# Patient Record
Sex: Male | Born: 1975 | Race: White | Hispanic: No | Smoking: Former smoker
Health system: Southern US, Community
[De-identification: ages and names within clinical notes are randomized; demographics above are authoritative.]

## PROBLEM LIST (undated history)

## (undated) DIAGNOSIS — Q893 Situs inversus: Secondary | ICD-10-CM

## (undated) DIAGNOSIS — M19079 Primary osteoarthritis, unspecified ankle and foot: Secondary | ICD-10-CM

## (undated) DIAGNOSIS — R195 Other fecal abnormalities: Secondary | ICD-10-CM

## (undated) DIAGNOSIS — M170 Bilateral primary osteoarthritis of knee: Secondary | ICD-10-CM

## (undated) DIAGNOSIS — I10 Essential (primary) hypertension: Secondary | ICD-10-CM

## (undated) DIAGNOSIS — J339 Nasal polyp, unspecified: Secondary | ICD-10-CM

## (undated) DIAGNOSIS — R030 Elevated blood-pressure reading, without diagnosis of hypertension: Secondary | ICD-10-CM

## (undated) DIAGNOSIS — M1990 Unspecified osteoarthritis, unspecified site: Secondary | ICD-10-CM

## (undated) HISTORY — PX: TONSILLECTOMY: SUR1361

## (undated) HISTORY — PX: OTHER SURGICAL HISTORY: SHX169

## (undated) HISTORY — PX: SEPTOPLASTY: SUR1290

---

## 2016-01-26 ENCOUNTER — Encounter: Admission: RE | Payer: Self-pay | Source: Ambulatory Visit

## 2016-01-26 ENCOUNTER — Ambulatory Visit: Admission: RE | Admit: 2016-01-26 | Payer: BLUE CROSS/BLUE SHIELD | Source: Ambulatory Visit | Admitting: Otolaryngology

## 2016-01-26 SURGERY — SINUS SURGERY, WITH IMAGING GUIDANCE
Anesthesia: General

## 2016-09-18 ENCOUNTER — Encounter: Payer: Self-pay | Admitting: Emergency Medicine

## 2016-09-18 ENCOUNTER — Ambulatory Visit (INDEPENDENT_AMBULATORY_CARE_PROVIDER_SITE_OTHER): Payer: Self-pay

## 2016-09-18 ENCOUNTER — Ambulatory Visit
Admission: EM | Admit: 2016-09-18 | Discharge: 2016-09-18 | Disposition: A | Discharge status: Home / Self Care | Payer: Self-pay | Attending: Emergency Medicine | Admitting: Emergency Medicine

## 2016-09-18 DIAGNOSIS — N179 Acute kidney failure, unspecified: Secondary | ICD-10-CM

## 2016-09-18 DIAGNOSIS — J209 Acute bronchitis, unspecified: Secondary | ICD-10-CM

## 2016-09-18 LAB — CBC WITH DIFFERENTIAL/PLATELET
BASOS PCT: 0 %
Basophils Absolute: 0 10*3/uL (ref 0–0.1)
Eosinophils Absolute: 0 10*3/uL (ref 0–0.7)
Eosinophils Relative: 0 %
HEMATOCRIT: 51.7 % (ref 40.0–52.0)
HEMOGLOBIN: 17.7 g/dL (ref 13.0–18.0)
LYMPHS PCT: 11 %
Lymphs Abs: 1.2 10*3/uL (ref 1.0–3.6)
MCH: 30.5 pg (ref 26.0–34.0)
MCHC: 34.2 g/dL (ref 32.0–36.0)
MCV: 89.1 fL (ref 80.0–100.0)
Monocytes Absolute: 1.1 10*3/uL — ABNORMAL HIGH (ref 0.2–1.0)
Monocytes Relative: 11 %
NEUTROS ABS: 7.9 10*3/uL — AB (ref 1.4–6.5)
NEUTROS PCT: 78 %
Platelets: 127 10*3/uL — ABNORMAL LOW (ref 150–440)
RBC: 5.81 MIL/uL (ref 4.40–5.90)
RDW: 12.5 % (ref 11.5–14.5)
WBC: 10.2 10*3/uL (ref 3.8–10.6)

## 2016-09-18 LAB — BASIC METABOLIC PANEL
ANION GAP: 11 (ref 5–15)
BUN: 17 mg/dL (ref 6–20)
CALCIUM: 9 mg/dL (ref 8.9–10.3)
CO2: 22 mmol/L (ref 22–32)
Chloride: 98 mmol/L — ABNORMAL LOW (ref 101–111)
Creatinine, Ser: 1.61 mg/dL — ABNORMAL HIGH (ref 0.61–1.24)
GFR calc non Af Amer: 52 mL/min — ABNORMAL LOW (ref >60)
GFR, EST AFRICAN AMERICAN: 60 mL/min — AB (ref >60)
Glucose, Bld: 122 mg/dL — ABNORMAL HIGH (ref 65–99)
POTASSIUM: 4.1 mmol/L (ref 3.5–5.1)
Sodium: 131 mmol/L — ABNORMAL LOW (ref 135–145)

## 2016-09-18 LAB — RAPID INFLUENZA A&B ANTIGENS (ARMC ONLY)
INFLUENZA A (ARMC): NEGATIVE
INFLUENZA B (ARMC): NEGATIVE

## 2016-09-18 MED ORDER — HYDROCOD POLST-CPM POLST ER 10-8 MG/5ML PO SUER: 5.0000 mL | Freq: Every evening | ORAL | 0 refills | Status: DC | PRN

## 2016-09-18 MED ORDER — DOXYCYCLINE HYCLATE 100 MG PO CAPS: 100.0000 mg | ORAL_CAPSULE | Freq: Two times a day (BID) | ORAL | 0 refills | Status: DC

## 2016-09-18 MED ORDER — IPRATROPIUM-ALBUTEROL 0.5-2.5 (3) MG/3ML IN SOLN
3.0000 mL | Freq: Once | RESPIRATORY_TRACT | Status: AC
Start: 2016-09-18 — End: 2016-09-18
  Administered 2016-09-18: 3 mL via RESPIRATORY_TRACT

## 2016-09-18 MED ORDER — PREDNISONE 10 MG PO TABS
ORAL_TABLET | ORAL | 0 refills | Status: DC
Start: 2016-09-18 — End: 2022-04-03

## 2016-09-18 NOTE — ED Provider Notes (Signed)
[facility] ____________________________________________  Time seen: Approximately 12:01 PM  I have reviewed the triage vital signs and the nursing notes.   HISTORY  Chief Complaint Cough and Fever  HPI Danny Hanson is a 41 y.o. male presents for the complaints of 5 days of cough, chest congestion, runny nose, nasal congestion and body aches. Patient states that he is here because of "bad cough". Reports fever maximum of 102 orally within the first few days of symptom onset. Patient reports gradual onset of wheezing, chest tightness with cough. Patient states the cough is primarily nonproductive. Patient reports that he has intermittently used home albuterol nebulizer without symptom resolution. States home albuterol helps briefly, but reports he is only using twice a day. Reports he last used home albuterol inhaler a few hours ago and states he does not want a breathing treatment here today. Patient reports that he feels sore all over from coughing including soreness in his stomach as well as chest and states that this is from coughing. Denies chest pain.Denies shortness of breath. States chest congestion and wheezing, but states NO shortness of breath or pain with breathing.  Patient reports that he has not been sleeping well at all because of the cough. Patient reports some intermittent diarrhea. Some nausea. Denies vomiting. Reports continues to drink fluids well, decreased appetite. Denies abdominal pain. Denies recent sickness, recent hospitalization, extremity pain, extremity swelling, fall or injury. Denies hemoptysis. Denies hematochezia, melena or abnormal stools. Patient reports symptoms unresolved with over-the-counter cough and congestion medications. Patient also reports that he had a few leftover Augmentin pills and he has been taking without any change of symptoms.  Patient reports that he does have a history of chronic sinusitis and bronchitis, and he is a  smoker.  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  There are no active problems to display for this patient.  surgical history.  Reports he had tympanostomy tubes placed last year. Denies other surgeries.  No current facility-administered medications for this encounter.   Current Outpatient Prescriptions:  .  albuterol (PROVENTIL HFA;VENTOLIN HFA) 108 (90 Base) MCG/ACT inhaler, Inhale 2 puffs into the lungs every 6 (six) hours as needed for wheezing or shortness of breath., Disp: , Rfl:  .  amoxicillin-clavulanate (AUGMENTIN) 875-125 MG tablet, Take 1 tablet by mouth 2 (two) times daily., Disp: , Rfl:  .  chlorpheniramine-HYDROcodone (TUSSIONEX PENNKINETIC ER) 10-8 MG/5ML SUER, Take 5 mLs by mouth at bedtime as needed. do not drive or operate machinery while taking as can cause drowsiness., Disp: 75 mL, Rfl: 0 .  doxycycline (VIBRAMYCIN) 100 MG capsule, Take 1 capsule (100 mg total) by mouth 2 (two) times daily., Disp: 20 capsule, Rfl: 0 .  predniSONE (DELTASONE) 10 MG tablet, Start 60 mg po day one, then 50 mg po day two, taper by 10 mg daily until complete., Disp: 21 tablet, Rfl: 0  Allergies Shellfish allergy  family history Father: Liver issues, alcoholism Mother: Reports alive and well.  Social History Social History  Substance Use Topics  . Smoking status: Current Every Day Smoker    Packs/day: 1.00    Types: Cigarettes  . Smokeless tobacco: Never Used  . Alcohol use Yes  States occasional alcohol use. Denies drug use.  Review of Systems Constitutional: As above. Eyes: No visual changes. ENT: Denies sore throat. Cardiovascular: Denies chest pain. Respiratory: Denies shortness of breath. Gastrointestinal:No nausea, no vomiting.   No constipation. Genitourinary: Negative for dysuria.  Musculoskeletal: Negative for back pain. Skin: Negative for rash. Neurological: Negative for focal weakness or  numbness.  10-point ROS otherwise negative.  ____________________________________________   PHYSICAL EXAM:  VITAL SIGNS: ED Triage Vitals  Enc Vitals Group     BP 09/18/16 1054 97/83     Pulse Rate 09/18/16 1054 100     Resp 09/18/16 1054 20     Temp 09/18/16 1054 97.7 F (36.5 C)     Temp Source 09/18/16 1054 Oral     SpO2 09/18/16 1054 95 %     Weight 09/18/16 1051 165 lb (74.8 kg)     Height 09/18/16 1051 5' 6"  (1.676 m)     Head Circumference --      Peak Flow --      Pain Score 09/18/16 1054 7     Pain Loc --      Pain Edu? --      Excl. in [place]? --    Today's Vitals   09/18/16 1157 09/18/16 1246 09/18/16 1406 09/18/16 1407  BP:  113/76    Pulse: (!) 105 (!) 104    Resp:  (!) 21    Temp:      TempSrc:      SpO2: 95% 97%    Weight:      Height:      PainSc:   5  7     Constitutional: Alert and oriented. Well appearing and in no acute distress. Eyes: Conjunctivae are normal. PERRL. EOMI. Head: Atraumatic. No sinus tenderness to palpation. No swelling. No erythema.  Ears: no erythema, normal TMs bilaterally.   Nose:Nasal congestion with clear rhinorrhea  Mouth/Throat: Mucous membranes are moist. Mild pharyngeal erythema. No tonsillar swelling or exudate.  Neck: No stridor.  No cervical spine tenderness to palpation. Hematological/Lymphatic/Immunilogical: No cervical lymphadenopathy. Cardiovascular: Distant heart sounds right chest. Good peripheral circulation. Respiratory:  Moderate diffuse wheezing with scattered rhonchi. Intermittent dry cough noted. Mildly decreased breath sounds in bases. Speaks in complete sentences. Gastrointestinal: Soft and nontender. No CVA tenderness. Musculoskeletal: No lower or upper extremity tenderness nor edema. Ambulatory with steady gait. No cervical, thoracic or lumbar tenderness to palpation. Neurologic:  Normal speech and language. No gait instability. Skin:  Skin is warm, dry and intact. No rash noted. Psychiatric: Mood and  affect are normal. Speech and behavior are normal.  ___________________________________________   LABS (all labs ordered are listed, but only abnormal results are displayed)  Labs Reviewed  CBC WITH DIFFERENTIAL/PLATELET - Abnormal; Notable for the following:       Result Value   Platelets 127 (*)    Neutro Abs 7.9 (*)    Monocytes Absolute 1.1 (*)    All other components within normal limits  BASIC METABOLIC PANEL - Abnormal; Notable for the following:    Sodium 131 (*)    Chloride 98 (*)    Glucose, Bld 122 (*)    Creatinine, Ser 1.61 (*)    GFR calc non Af Amer 52 (*)    GFR calc Af Amer 60 (*)    All other components within normal limits  RAPID INFLUENZA A&B ANTIGENS ([facility] ONLY)    RADIOLOGY  Dg Chest 2 View  Result Date: 09/18/2016 CLINICAL DATA:  Cough and fever for several days EXAM: CHEST  2 VIEW COMPARISON:  None. FINDINGS: Cardiac shadow is directed towards the right and there is evidence of sinus inversus. Upon further conversation with the patient, this history is confirmed. The lungs are clear bilaterally. No sizable  effusion is seen. No bony abnormality is noted. IMPRESSION: Changes consistent with sinus inversus. No acute abnormality is noted. Electronically Signed   By: Inez Catalina M.D.   On: 09/18/2016 12:50   ____________________________________________   PROCEDURES Procedures    INITIAL IMPRESSION / ASSESSMENT AND PLAN / ED COURSE  Pertinent labs & imaging results that were available during my care of the patient were reviewed by me and considered in my medical decision making (see chart for details).  Patient with diffuse wheezing, presented for chief complaint of a bad cough. States that he is not short of breath but here for cough. Cardiac sounds somewhat distant, wheezes present. Discussed in detail with patient concerning regarding diffuse wheezing and report of symptoms. Patient continues to say that he did not want a breathing treatment. Recommend  breathing treatment, patient refused. 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 Patient agreed to evaluate chest x-ray. Ambulatory O2 sat 95%.  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 Re-auscultated cardiac and pulmonary. Distant heart sounds difficulty to clearly auscultate with overlying wheezing. Wheezing throughout. Patient now agrees to breathing treatments. Will administer two duonebs. Denies other evaluation at this time.   1300: Patient reevaluated after breathing treatments. Patient reports that he is feeling better. Wheezes have improved. Patient does still have some scattered wheezing. Patient reports chest tightness has improved. Patient states no chest pain or shortness of breath. Patient again states that he is feeling better and states "I feel more alive ". Chest x-ray per radiologist no acute abnormality noted, sinus inversus.  In further discussing with patient, patient states that he does not want to be in the hospital. Discussed in detail with patient wheezing has improved and patient's reporting that he is feeling better. Discussed with patient and recommends evaluation CBC and BMP, patient now agrees to this. Plan to treat patient with oral prednisone taper, home albuterol treatments, when necessary Tussionex and doxycyline.   1445: Patient reports he is still feeling better. No acute distress. Normal appearance respirations. No appearance of distress. Labs reviewed. Creatinine 1.61, GFR 52.  Suspect acute kidney injury second to diarrhea, and decreased appetite. Discuss his laboratory results in detail with patient and copy given. Discussed the need for patient to have adequate hydration and intake and close follow-up. Patient continuously states he  does not want to be in the hospital. States will eat and drink well. Discussed with patient continues to have PCP follow up in 1-2 days. And have labs reevaluated. Will treat patient with oral doxycycline, prednisone taper, Tussionex and instructed to use home albuterol every 4 hours. Discussed very strict follow-up and return parameters with patient including for any worsening proceeding directly to the emergency room. Patient appears stable at time of discharge.  Discussed follow up with Primary care physician this week. Discussed follow up and return parameters including no resolution or any worsening concerns. Patient verbalized understanding and agreed to plan.   Discussed patient and plan of care with Dr Alphonzo Cruise who reviewed patient, and agrees with plan of care.  ____________________________________________   FINAL CLINICAL IMPRESSION(S) / ED DIAGNOSES  Final diagnoses:  Bronchitis, acute, with bronchospasm  Acute kidney injury [facility])     Discharge Medication List as of 09/18/2016  2:00 PM    START taking these medications   Details  chlorpheniramine-HYDROcodone (TUSSIONEX PENNKINETIC ER) 10-8 MG/5ML SUER Take 5 mLs by mouth at bedtime as needed. do not drive or operate machinery while taking as can cause drowsiness., Starting Sun 09/18/2016, Print    doxycycline (VIBRAMYCIN) 100 MG capsule Take 1 capsule (100 mg total) by mouth 2 (two) times daily., Starting Sun 09/18/2016, Print    predniSONE (DELTASONE) 10 MG tablet Start 60 mg po day one, then 50 mg po day two, taper by 10 mg daily until complete., Print        Note: This dictation was prepared with Dragon dictation along with smaller phrase technology. Any transcriptional errors that result from this process are unintentional.    Clinical Course       Marylene Land, NP 09/18/16 [place], NP 09/18/16 [place], NP 09/18/16 1735

## 2016-09-18 NOTE — ED Triage Notes (Signed)
Patient c/o cough and chest congestion, fever, HAs, nausea, and bodyaches since Wed.

## 2016-09-18 NOTE — ED Notes (Signed)
Patient states that he did a breathing treatment at home about 1 hour ago and does not want to do one here.

## 2016-09-18 NOTE — Discharge Instructions (Signed)
Take medication as prescribed. Rest. Drink plenty of fluids.   Follow up with your primary care physician in 1-2 days. Close follow up is important. Return to Urgent care or ER as discussed, for any shortness of breath, continued fevers, difficulty breathing, for new or worsening concerns.

## 2017-09-19 IMAGING — CR DG CHEST 2V
2 series · 2 of 2 positions shown · non-contrast
Comparison: None.

CLINICAL DATA: Cough and fever for several days

EXAM:
CHEST  2 VIEW

[chest pa]
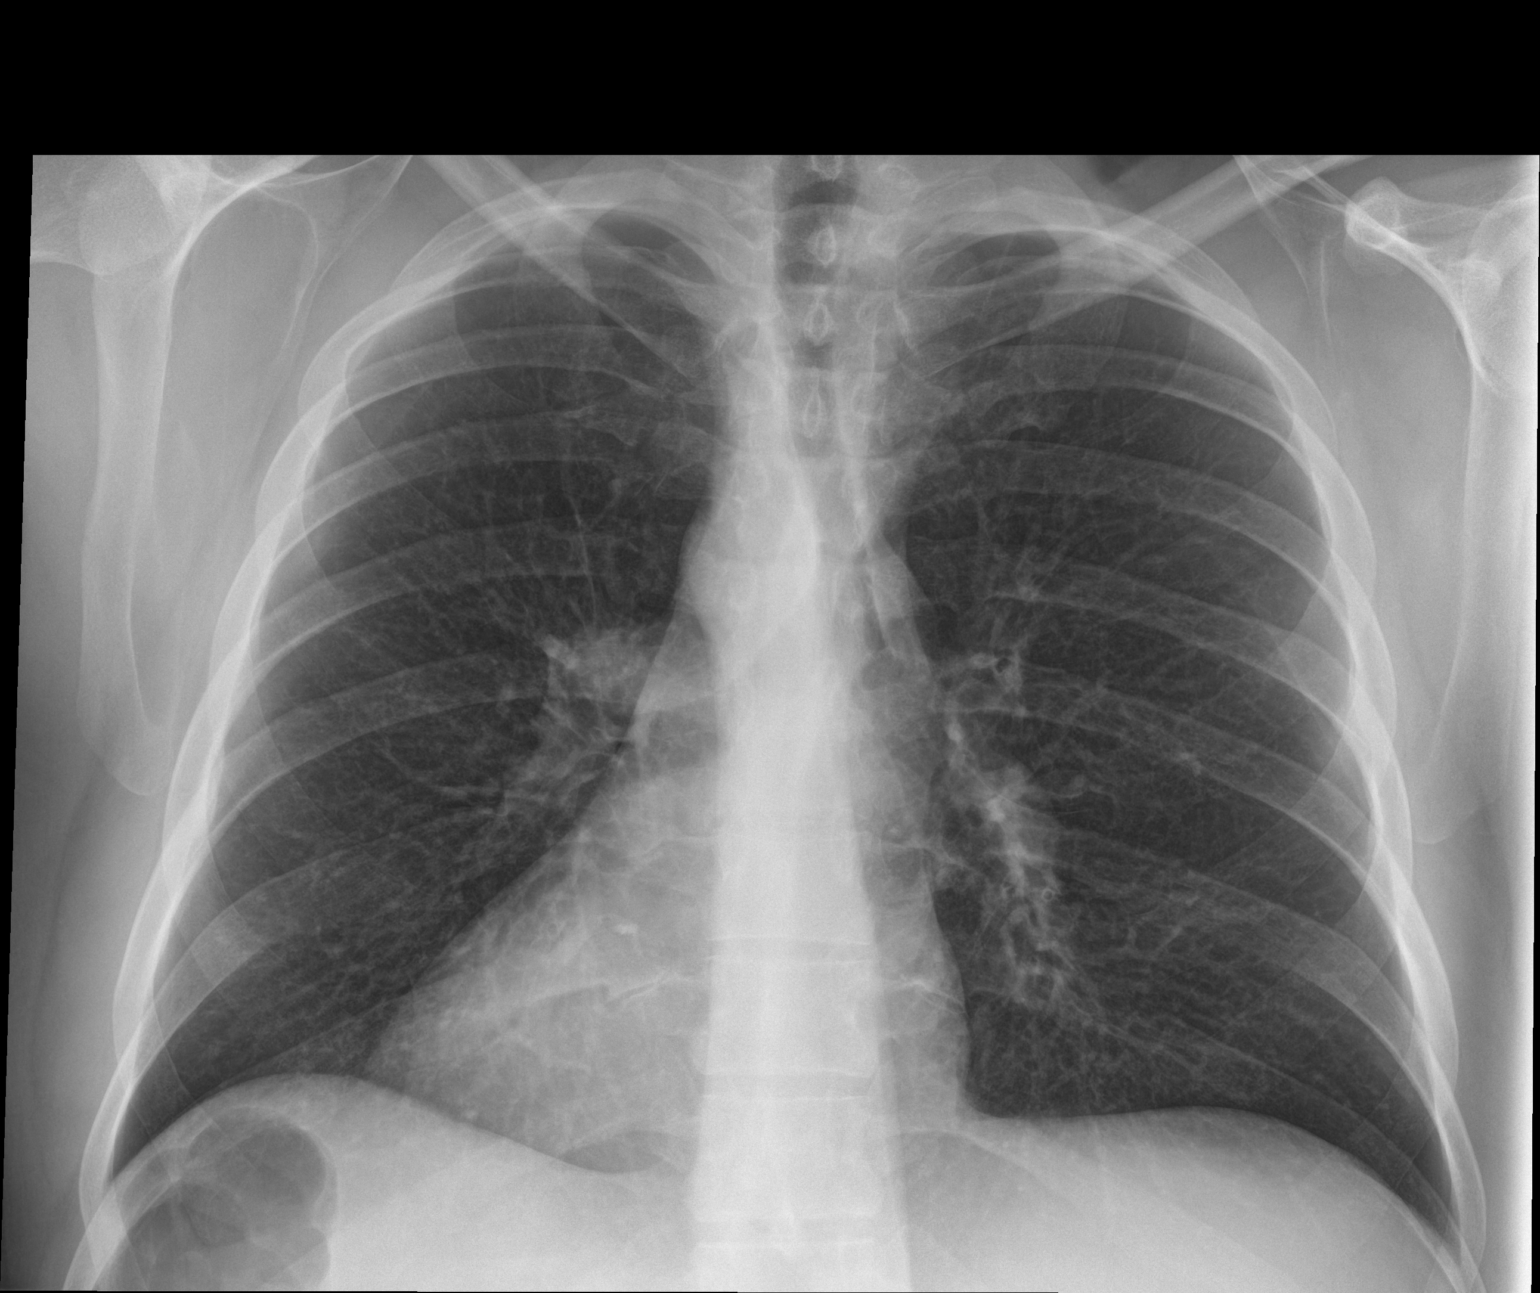

[chest lat]
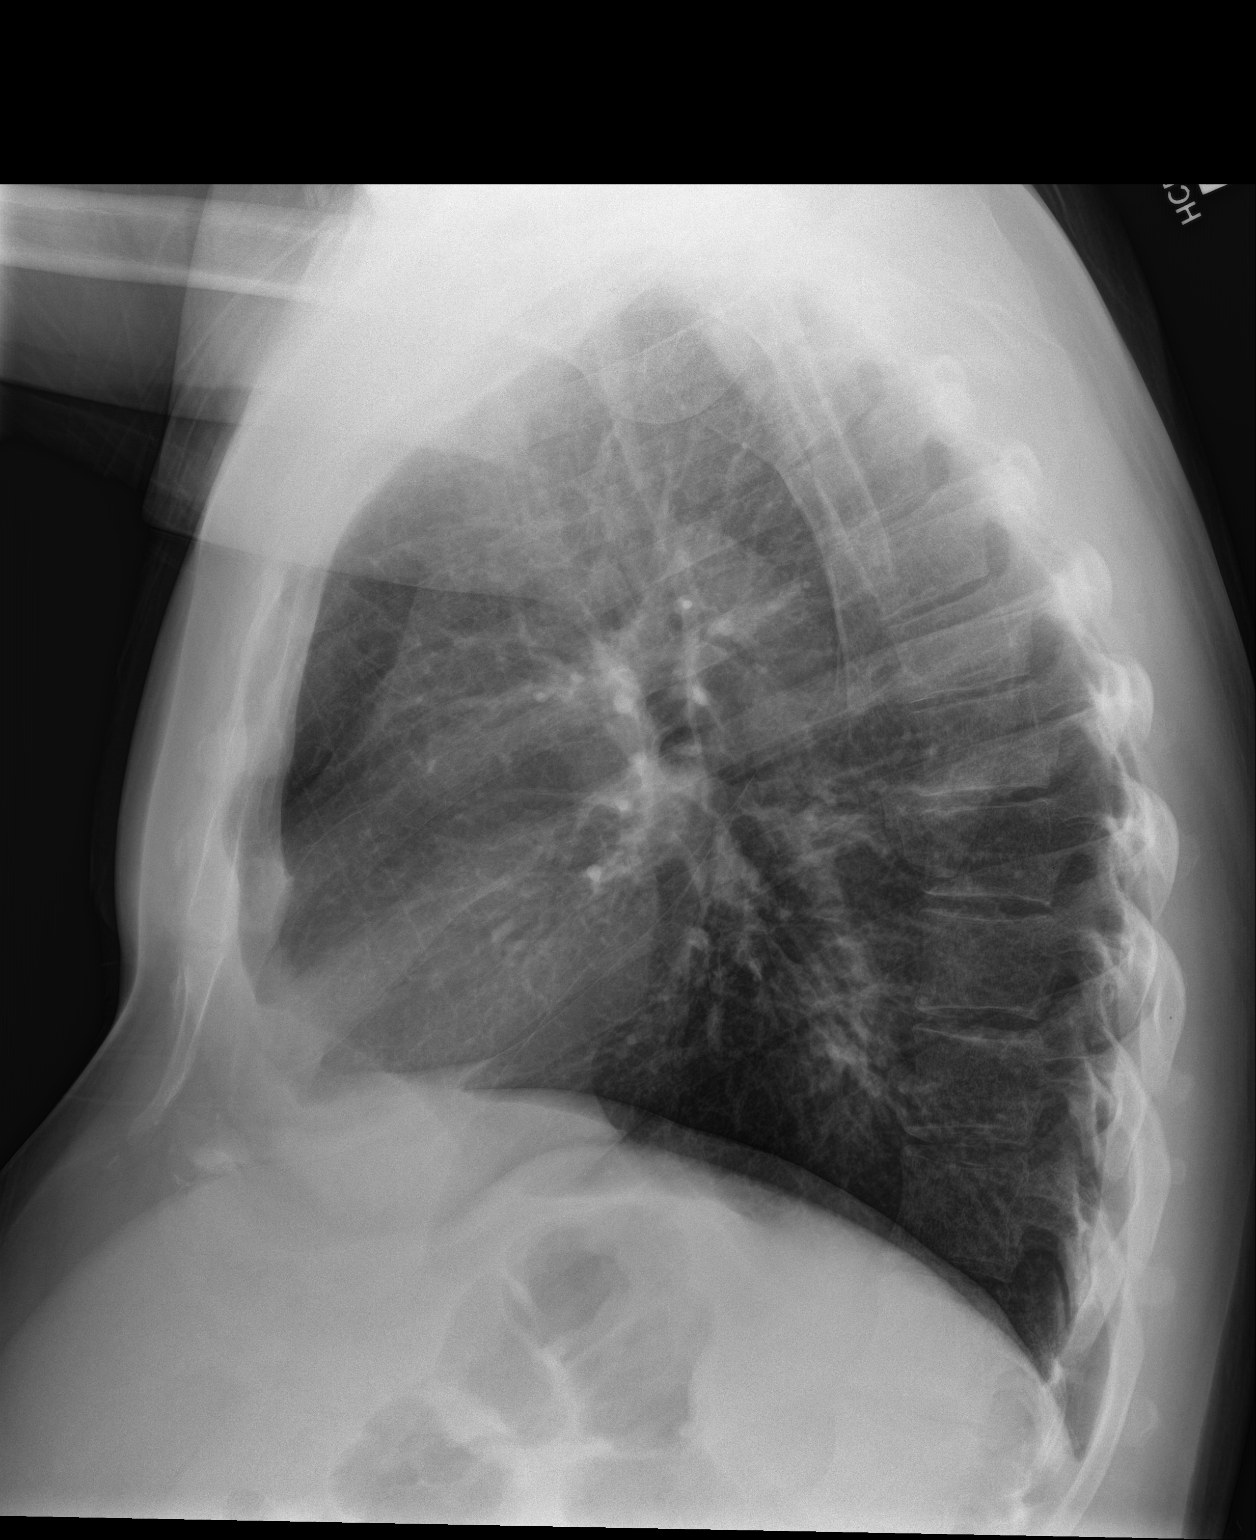

[2 of 2 positions shown; findings below may reference images not displayed]

FINDINGS: Cardiac shadow is directed towards the right and there is evidence
of sinus inversus. Upon further conversation with the patient, this
history is confirmed. The lungs are clear bilaterally. No sizable
effusion is seen. No bony abnormality is noted.
IMPRESSION: Changes consistent with sinus inversus.

No acute abnormality is noted.

## 2019-03-05 ENCOUNTER — Telehealth: Payer: Self-pay | Admitting: *Deleted

## 2019-03-05 DIAGNOSIS — Z20822 Contact with and (suspected) exposure to covid-19: Secondary | ICD-10-CM

## 2019-03-05 NOTE — Telephone Encounter (Signed)
Pt scheduled for covid testing tomorrow at [facility]. Requested by [facility]. Pt with possible exposure, high risk.  Testing process reviewed, stay in car, wear mask. Pt verbalizes understanding.  Pts [phone]# [phone]

## 2019-03-06 ENCOUNTER — Other Ambulatory Visit: Payer: Self-pay

## 2019-03-06 DIAGNOSIS — Z20822 Contact with and (suspected) exposure to covid-19: Secondary | ICD-10-CM

## 2019-03-13 LAB — NOVEL CORONAVIRUS, NAA: SARS-CoV-2, NAA: NOT DETECTED

## 2022-04-03 ENCOUNTER — Ambulatory Visit
Admission: EM | Admit: 2022-04-03 | Discharge: 2022-04-03 | Disposition: A | Discharge status: Home / Self Care | Payer: BC Managed Care – PPO | Attending: Emergency Medicine | Admitting: Emergency Medicine

## 2022-04-03 ENCOUNTER — Encounter: Payer: Self-pay | Admitting: Emergency Medicine

## 2022-04-03 DIAGNOSIS — J4521 Mild intermittent asthma with (acute) exacerbation: Secondary | ICD-10-CM | POA: Diagnosis not present

## 2022-04-03 DIAGNOSIS — H66003 Acute suppurative otitis media without spontaneous rupture of ear drum, bilateral: Secondary | ICD-10-CM

## 2022-04-03 DIAGNOSIS — J069 Acute upper respiratory infection, unspecified: Secondary | ICD-10-CM | POA: Diagnosis not present

## 2022-04-03 MED ORDER — AMOXICILLIN-POT CLAVULANATE 875-125 MG PO TABS: 1.0000 | ORAL_TABLET | Freq: Two times a day (BID) | ORAL | 0 refills | Status: AC

## 2022-04-03 MED ORDER — PREDNISONE 20 MG PO TABS: 60.0000 mg | ORAL_TABLET | Freq: Every day | ORAL | 0 refills | Status: AC

## 2022-04-03 NOTE — ED Triage Notes (Signed)
Pt reports had a cough and sore throat for 3 days. Pt started having fever yesterday. Taking albuterol nebs and alka seltzer for symptoms.

## 2022-04-03 NOTE — ED Provider Notes (Signed)
Renaldo Fiddler    CSN: [id number] Arrival date & time: 04/03/22  6222      History   Chief Complaint Chief Complaint  Patient presents with   Fever   Cough   Sore Throat    HPI Gleb Mcguire is a 46 y.o. male.   HPI  46 year old male here for evaluation of respiratory complaints.  Patient reports that the last 3 days he has been experiencing sore throat and a cough that is mostly nonproductive but does on occasion produce some mucus.  Yesterday he started running fever with a Tmax of 101.  He does have wheezing, but states that he is wheezing all the time he does use albuterol nebs for his symptoms.  He does endorse runny nose and nasal congestion with yellow nasal discharge as well.  He denies any ear pain but states they do feel full and he also denies shortness of breath.  History reviewed. No pertinent past medical history.  There are no problems to display for this patient.   History reviewed. No pertinent surgical history.     Home Medications    Prior to Admission medications   Medication Sig Start Date End Date Taking? Authorizing Provider  amoxicillin-clavulanate (AUGMENTIN) 875-125 MG tablet Take 1 tablet by mouth every 12 (twelve) hours. 04/03/22  Yes Becky Augusta, NP  predniSONE (DELTASONE) 20 MG tablet Take 3 tablets (60 mg total) by mouth daily with breakfast for 5 days. 3 tablets daily for 5 days. 04/03/22 04/08/22 Yes Becky Augusta, NP  albuterol (PROVENTIL HFA;VENTOLIN HFA) 108 (90 Base) MCG/ACT inhaler Inhale 2 puffs into the lungs every 6 (six) hours as needed for wheezing or shortness of breath.    [provider]    Family History No family history on file.  Social History Social History   Tobacco Use   Smoking status: Every Day    Packs/day: 1.00    Types: Cigarettes   Smokeless tobacco: Never  Substance Use Topics   Alcohol use: Yes     Allergies   Shellfish allergy   Review of Systems Review of Systems   Constitutional:  Positive for fever.  HENT:  Positive for congestion, ear pain, rhinorrhea and sore throat. Negative for ear discharge.   Respiratory:  Positive for cough and wheezing. Negative for shortness of breath.   Hematological: Negative.  Does not bruise/bleed easily.  Psychiatric/Behavioral: Negative.       Physical Exam Triage Vital Signs ED Triage Vitals  Enc Vitals Group     BP      Pulse      Resp      Temp      Temp src      SpO2      Weight      Height      Head Circumference      Peak Flow      Pain Score      Pain Loc      Pain Edu?      Excl. in [place]?    No data found.  Updated Vital Signs BP (!) 158/96 (BP Location: Right Arm)   Pulse 95   Temp 100.1 F (37.8 C) (Oral)   Resp 20   SpO2 95%   Visual Acuity Right Eye Distance:   Left Eye Distance:   Bilateral Distance:    Right Eye Near:   Left Eye Near:    Bilateral Near:     Physical Exam Vitals and nursing note reviewed.  Constitutional:      Appearance: Normal appearance. He is not ill-appearing.  HENT:     Head: Normocephalic and atraumatic.     Right Ear: Ear canal and external ear normal.     Left Ear: Ear canal and external ear normal.     Nose: Congestion and rhinorrhea present.     Mouth/Throat:     Mouth: Mucous membranes are moist.     Pharynx: Oropharynx is clear. Posterior oropharyngeal erythema present. No oropharyngeal exudate.  Cardiovascular:     Rate and Rhythm: Normal rate and regular rhythm.     Pulses: Normal pulses.     Heart sounds: Normal heart sounds. No murmur heard.    No friction rub. No gallop.  Pulmonary:     Effort: Pulmonary effort is normal.     Breath sounds: Wheezing present. No rhonchi or rales.  Musculoskeletal:     Cervical back: Normal range of motion and neck supple.  Lymphadenopathy:     Cervical: No cervical adenopathy.  Skin:    General: Skin is warm and dry.     Capillary Refill: Capillary refill takes less than 2 seconds.      Findings: No erythema or rash.  Neurological:     General: No focal deficit present.     Mental Status: He is alert and oriented to person, place, and time.  Psychiatric:        Mood and Affect: Mood normal.        Behavior: Behavior normal.        Thought Content: Thought content normal.        Judgment: Judgment normal.      UC Treatments / Results  Labs (all labs ordered are listed, but only abnormal results are displayed) Labs Reviewed - No data to display  EKG   Radiology No results found.  Procedures Procedures (including critical care time)  Medications Ordered in UC Medications - No data to display  Initial Impression / Assessment and Plan / UC Course  I have reviewed the triage vital signs and the nursing notes.  Pertinent labs & imaging results that were available during my care of the patient were reviewed by me and considered in my medical decision making (see chart for details).  Patient is a pleasant, nontoxic-appearing 46 year old male here for evaluation of respiratory symptoms as outlined in HPI above.  On exam patient has tympanostomy tubes in place bilaterally.  Both of his tympanic membranes are erythematous and injected.  I do not appreciate any discharge from the tubes and there is no debris in the external auditory canals as both of them are clear.  Nasal mucosa is erythematous and edematous with yellow discharge in both nares.  Oropharyngeal exam reveals 2+ edematous and erythematous tonsillar pillars.  No exudate appreciated.  No cervical lymphadenopathy appreciable exam.  Cardiopulmonary exam reveals scattered wheezes and bilateral upper lung fields.  Patient Damas consistent with an upper EXTR infection and bilateral otitis media.  I will treat him with Augmentin twice daily for 10 days.  I will have him continue his albuterol nebs at home and he is requesting a short course of prednisone to help with his breathing.  I will prescribe a 5-day burst dose of  60 mg daily.  Return precautions reviewed.   Final Clinical Impressions(s) / UC Diagnoses   Final diagnoses:  Acute upper respiratory infection  Non-recurrent acute suppurative otitis media of both ears without spontaneous rupture of tympanic membranes  Mild intermittent asthma  with acute exacerbation     Discharge Instructions      Take the Augmentin twice daily for 10 days with food for treatment of your ear infection.  Take an over-the-counter probiotic 1 hour after each dose of antibiotic to prevent diarrhea.  Use over-the-counter Tylenol and ibuprofen as needed for pain or fever.  Place a hot water bottle, or heating pad, underneath your pillowcase at night to help dilate up your ear and aid in pain relief as well as resolution of the infection.  Continue to use your albuterol nebulized treatments every 4-6 hours as needed for wheezing.  Start the prednisone 60 mg this morning and you will take 60 mg each day at breakfast for total of 5 days.  Return for reevaluation for any new or worsening symptoms.      ED Prescriptions     Medication Sig Dispense Auth. Provider   amoxicillin-clavulanate (AUGMENTIN) 875-125 MG tablet Take 1 tablet by mouth every 12 (twelve) hours. 14 tablet Becky Augusta, NP   predniSONE (DELTASONE) 20 MG tablet Take 3 tablets (60 mg total) by mouth daily with breakfast for 5 days. 3 tablets daily for 5 days. 15 tablet Becky Augusta, NP      PDMP not reviewed this encounter.   Becky Augusta, NP 04/03/22 1057

## 2022-04-03 NOTE — Discharge Instructions (Addendum)
Take the Augmentin twice daily for 10 days with food for treatment of your ear infection.  Take an over-the-counter probiotic 1 hour after each dose of antibiotic to prevent diarrhea.  Use over-the-counter Tylenol and ibuprofen as needed for pain or fever.  Place a hot water bottle, or heating pad, underneath your pillowcase at night to help dilate up your ear and aid in pain relief as well as resolution of the infection.  Continue to use your albuterol nebulized treatments every 4-6 hours as needed for wheezing.  Start the prednisone 60 mg this morning and you will take 60 mg each day at breakfast for total of 5 days.  Return for reevaluation for any new or worsening symptoms.

## 2022-04-28 ENCOUNTER — Ambulatory Visit: Admit: 2022-04-28 | Payer: BC Managed Care – PPO | Admitting: Gastroenterology

## 2022-04-28 SURGERY — COLONOSCOPY WITH PROPOFOL
Anesthesia: General

## 2022-09-06 ENCOUNTER — Other Ambulatory Visit: Payer: Self-pay | Admitting: Infectious Diseases

## 2022-09-06 DIAGNOSIS — R195 Other fecal abnormalities: Secondary | ICD-10-CM

## 2022-09-06 DIAGNOSIS — R7989 Other specified abnormal findings of blood chemistry: Secondary | ICD-10-CM

## 2022-09-14 ENCOUNTER — Ambulatory Visit
Admission: RE | Admit: 2022-09-14 | Discharge: 2022-09-14 | Disposition: A | Discharge status: Home / Self Care | Payer: BC Managed Care – PPO | Source: Ambulatory Visit | Attending: Infectious Diseases | Admitting: Infectious Diseases

## 2022-09-14 DIAGNOSIS — R195 Other fecal abnormalities: Secondary | ICD-10-CM | POA: Diagnosis present

## 2022-09-14 DIAGNOSIS — R7989 Other specified abnormal findings of blood chemistry: Secondary | ICD-10-CM | POA: Diagnosis present

## 2022-09-16 ENCOUNTER — Other Ambulatory Visit: Payer: Self-pay

## 2024-01-25 ENCOUNTER — Encounter: Payer: Self-pay | Admitting: Internal Medicine

## 2024-01-30 ENCOUNTER — Encounter: Payer: Self-pay | Admitting: Internal Medicine

## 2024-02-06 ENCOUNTER — Encounter
Admission: RE | Disposition: A | Discharge status: Home / Self Care | Payer: Self-pay | Source: Home / Self Care | Attending: Internal Medicine

## 2024-02-06 ENCOUNTER — Other Ambulatory Visit: Payer: Self-pay

## 2024-02-06 ENCOUNTER — Encounter: Payer: Self-pay | Admitting: Internal Medicine

## 2024-02-06 ENCOUNTER — Ambulatory Visit

## 2024-02-06 ENCOUNTER — Ambulatory Visit
Admission: RE | Admit: 2024-02-06 | Discharge: 2024-02-06 | Disposition: A | Discharge status: Home / Self Care | Attending: Internal Medicine | Admitting: Internal Medicine

## 2024-02-06 DIAGNOSIS — D120 Benign neoplasm of cecum: Secondary | ICD-10-CM | POA: Insufficient documentation

## 2024-02-06 DIAGNOSIS — Z79899 Other long term (current) drug therapy: Secondary | ICD-10-CM | POA: Insufficient documentation

## 2024-02-06 DIAGNOSIS — I10 Essential (primary) hypertension: Secondary | ICD-10-CM | POA: Insufficient documentation

## 2024-02-06 DIAGNOSIS — Z87891 Personal history of nicotine dependence: Secondary | ICD-10-CM | POA: Diagnosis not present

## 2024-02-06 DIAGNOSIS — D128 Benign neoplasm of rectum: Secondary | ICD-10-CM | POA: Insufficient documentation

## 2024-02-06 DIAGNOSIS — R195 Other fecal abnormalities: Secondary | ICD-10-CM | POA: Insufficient documentation

## 2024-02-06 DIAGNOSIS — Z1211 Encounter for screening for malignant neoplasm of colon: Secondary | ICD-10-CM | POA: Diagnosis present

## 2024-02-06 DIAGNOSIS — Q893 Situs inversus: Secondary | ICD-10-CM | POA: Insufficient documentation

## 2024-02-06 DIAGNOSIS — D122 Benign neoplasm of ascending colon: Secondary | ICD-10-CM | POA: Diagnosis not present

## 2024-02-06 HISTORY — PX: POLYPECTOMY: SHX149

## 2024-02-06 HISTORY — DX: Unspecified osteoarthritis, unspecified site: M19.90

## 2024-02-06 HISTORY — DX: Primary osteoarthritis, unspecified ankle and foot: M19.079

## 2024-02-06 HISTORY — PX: COLONOSCOPY: SHX5424

## 2024-02-06 HISTORY — DX: Nasal polyp, unspecified: J33.9

## 2024-02-06 HISTORY — DX: Essential (primary) hypertension: I10

## 2024-02-06 HISTORY — DX: Bilateral primary osteoarthritis of knee: M17.0

## 2024-02-06 HISTORY — DX: Situs inversus: Q89.3

## 2024-02-06 HISTORY — DX: Other fecal abnormalities: R19.5

## 2024-02-06 HISTORY — DX: Elevated blood-pressure reading, without diagnosis of hypertension: R03.0

## 2024-02-06 SURGERY — COLONOSCOPY
Anesthesia: General

## 2024-02-06 MED ORDER — DEXMEDETOMIDINE HCL IN NACL 80 MCG/20ML IV SOLN
INTRAVENOUS | Status: DC | PRN
Start: 2024-02-06 — End: 2024-02-06
  Administered 2024-02-06 (×2): 20 ug via INTRAVENOUS

## 2024-02-06 MED ORDER — PROPOFOL 500 MG/50ML IV EMUL
INTRAVENOUS | Status: DC | PRN
  Administered 2024-02-06: 50 mg via INTRAVENOUS
  Administered 2024-02-06: 150 ug/kg/min via INTRAVENOUS
  Administered 2024-02-06: 80 mg via INTRAVENOUS
  Administered 2024-02-06: 50 mg via INTRAVENOUS

## 2024-02-06 MED ORDER — LIDOCAINE HCL (CARDIAC) PF 100 MG/5ML IV SOSY
PREFILLED_SYRINGE | INTRAVENOUS | Status: DC | PRN
  Administered 2024-02-06: 100 mg via INTRAVENOUS

## 2024-02-06 MED ORDER — SODIUM CHLORIDE 0.9 % IV SOLN: INTRAVENOUS | Status: DC

## 2024-02-06 NOTE — Interval H&P Note (Signed)
 History and Physical Interval Note:  02/06/2024 12:41 PM  Danny Hanson  has presented today for surgery, with the diagnosis of Positive colorectal cancer screening using Cologuard test (R19.5).  The various methods of treatment have been discussed with the patient and family. After consideration of risks, benefits and other options for treatment, the patient has consented to  Procedure(s): COLONOSCOPY (N/A) as a surgical intervention.  The patient's history has been reviewed, patient examined, no change in status, stable for surgery.  I have reviewed the patient's chart and labs.  Questions were answered to the patient's satisfaction.     [name], Evee Liska

## 2024-02-06 NOTE — H&P (Signed)
 Outpatient short stay form Pre-procedure 02/06/2024 12:40 PM Janissa Bertram K. Corky Diener, M.D.  Primary Physician: [name], M.D.  Reason for visit:  Positive Cologuard test  History of present illness:   Mr. Midgley is a very pleasant 48 year old male with a history of Kartagener syndrome (situs inversus) who presents today for positive Cologuard test. Patient denies any upper GI symptoms such as nausea, vomiting, dysphagia, or severe GERD (he does have heartburn when taking spicy foods), abdominal pain. He denies any lower GI symptoms such as change in bowel habits, rectal bleeding. He denies any family history of colorectal cancer or colon polyps. Patient does have a history of nasal polyposis and recently underwent bilateral sinus stents surgery. He would like to schedule colonoscopy.    Current Facility-Administered Medications:    0.9 %  sodium chloride infusion, , Intravenous, Continuous, Hankins, Alphonsus Jeans, MD, Last Rate: 20 mL/hr at 02/06/24 1150, New Bag at 02/06/24 1150  Medications Prior to Admission  Medication Sig Dispense Refill Last Dose/Taking   ascorbic Acid (VITAMIN C) 500 MG CPCR Take 500 mg by mouth daily.   Taking   fish oil-omega-3 fatty acids 1000 MG capsule Take 2 g by mouth daily.   Taking   glucosamine-chondroitin 500-400 MG tablet Take 1 tablet by mouth 3 (three) times daily.   Taking   Multiple Vitamin (MULTIVITAMIN WITH MINERALS) TABS tablet Take 1 tablet by mouth daily.   Taking   albuterol  (PROVENTIL  HFA;VENTOLIN  HFA) 108 (90 Base) MCG/ACT inhaler Inhale 2 puffs into the lungs every 6 (six) hours as needed for wheezing or shortness of breath. (Patient not taking: Reported on 01/25/2024)   Not Taking   amoxicillin -clavulanate (AUGMENTIN ) 875-125 MG tablet Take 1 tablet by mouth every 12 (twelve) hours. 14 tablet 0      Allergies  Allergen Reactions   Shellfish Allergy Nausea And Vomiting     Past Medical History:  Diagnosis Date   Arthritis    Elevated BP  without diagnosis of hypertension    Hypertension    Kartagener syndrome    Multiple nasal polyps    Osteoarthritis of ankle    Osteoarthritis of both knees    Positive colorectal cancer screening using Cologuard test    Situs inversus     Review of systems:  Otherwise negative.    Physical Exam  Gen: Alert, oriented. Appears stated age.  HEENT: [place]/AT. PERRLA. Lungs: CTA, no wheezes. CV: RR nl S1, S2. Abd: soft, benign, no masses. BS+ Ext: No edema. Pulses 2+    Planned procedures: Proceed with colonoscopy. The patient understands the nature of the planned procedure, indications, risks, alternatives and potential complications including but not limited to bleeding, infection, perforation, damage to internal organs and possible oversedation/side effects from anesthesia. The patient agrees and gives consent to proceed.  Please refer to procedure notes for findings, recommendations and patient disposition/instructions.     Markita Stcharles K. Corky Diener, M.D. Gastroenterology 02/06/2024  12:40 PM

## 2024-02-06 NOTE — Op Note (Signed)
 [facility] Gastroenterology Patient Name: Danny Hanson Procedure Date: 02/06/2024 12:24 PM MRN: [id number] Account #: [id number] Date of Birth: 1976-01-15 Admit Type: Outpatient Age: 48 Room: [facility] ENDO ROOM 1 Gender: Male Note Status: Finalized Instrument Name: Colonoscope [id number] Procedure:             Colonoscopy Indications:           Positive Cologuard test Providers:             Shakela Donati K. Corky Diener MD, MD Referring MD:          [name] (Referring MD) Medicines:             Propofol per Anesthesia Complications:         No immediate complications. Estimated blood loss: None. Procedure:             Pre-Anesthesia Assessment:                        - The risks and benefits of the procedure and the                         sedation options and risks were discussed with the                         patient. All questions were answered and informed                         consent was obtained.                        - Patient identification and proposed procedure were                         verified prior to the procedure by the nurse. The                         procedure was verified in the procedure room.                        - ASA Grade Assessment: III - A patient with severe                         systemic disease.                        - After reviewing the risks and benefits, the patient                         was deemed in satisfactory condition to undergo the                         procedure.                        After obtaining informed consent, the colonoscope was                         passed under direct vision. Throughout the procedure,  the patient's blood pressure, pulse, and oxygen                         saturations were monitored continuously. The                         Colonoscope was introduced through the anus and                         advanced to the the cecum, identified by appendiceal                          orifice and ileocecal valve. The colonoscopy was                         performed without difficulty. The patient tolerated                         the procedure well. The quality of the bowel                         preparation was good. The ileocecal valve, appendiceal                         orifice, and rectum were photographed. Findings:      The perianal and digital rectal examinations were normal. Pertinent       negatives include normal sphincter tone and no palpable rectal lesions.      Two semi-pedunculated polyps were found in the rectum. The polyps were 7       to 20 mm in size. These polyps were removed with a hot snare. Resection       and retrieval were complete. Estimated blood loss: none.      A 10 mm polyp was found in the cecum. The polyp was sessile. The polyp       was removed with a hot snare. Resection and retrieval were complete.      A 12 mm polyp was found in the proximal ascending colon. The polyp was       semi-pedunculated. The polyp was removed with a hot snare. Resection and       retrieval were complete. Estimated blood loss: none.      The exam was otherwise without abnormality on direct and retroflexion       views. Impression:            - Two 7 to 20 mm polyps in the rectum, removed with a                         hot snare. Resected and retrieved.                        - One 10 mm polyp in the cecum, removed with a hot                         snare. Resected and retrieved.                        - One 12 mm polyp in the proximal ascending colon,  removed with a hot snare. Resected and retrieved.                        - The examination was otherwise normal on direct and                         retroflexion views. Recommendation:        - Patient has a contact number available for                         emergencies. The signs and symptoms of potential                         delayed complications were discussed with the  patient.                         Return to normal activities tomorrow. Written                         discharge instructions were provided to the patient.                        - Resume previous diet.                        - Continue present medications.                        - Repeat colonoscopy is recommended for surveillance.                         The colonoscopy date will be determined after                         pathology results from today's exam become available                         for review.                        - Return to GI office PRN.                        - The findings and recommendations were discussed with                         the patient. Procedure Code(s):     --- Professional ---                        [phone], Colonoscopy, flexible; with removal of                         tumor(s), polyp(s), or other lesion(s) by snare                         technique Diagnosis Code(s):     --- Professional ---                        R19.5, Other fecal abnormalities  D12.2, Benign neoplasm of ascending colon                        D12.0, Benign neoplasm of cecum                        D12.8, Benign neoplasm of rectum CPT copyright 2022 [employer]. All rights reserved. The codes documented in this report are preliminary and upon coder review may  be revised to meet current compliance requirements. Cassie Click MD, MD 02/06/2024 1:11:25 PM This report has been signed electronically. Number of Addenda: 0 Note Initiated On: 02/06/2024 12:24 PM Scope Withdrawal Time: 0 hours 13 minutes 55 seconds  Total Procedure Duration: 0 hours 20 minutes 52 seconds  Estimated Blood Loss:  Estimated blood loss: none.      [facility]

## 2024-02-06 NOTE — Transfer of Care (Signed)
 Immediate Anesthesia Transfer of Care Note  Patient: Danny Hanson  Procedure(s) Performed: COLONOSCOPY POLYPECTOMY, INTESTINE  Patient Location: Endoscopy Unit  Anesthesia Type:General  Level of Consciousness: drowsy  Airway & Oxygen Therapy: Patient Spontanous Breathing  Post-op Assessment: Report given to RN and Post -op Vital signs reviewed and stable  Post vital signs: Reviewed and stable  Last Vitals:  Vitals Value Taken Time  BP 107/80 02/06/24 1312  Temp 36.1 C 02/06/24 1312  Pulse 80 02/06/24 1316  Resp 19 02/06/24 1315  SpO2 95 % 02/06/24 1316  Vitals shown include unfiled device data.  Last Pain:  Vitals:   02/06/24 1312  TempSrc: Temporal  PainSc: Asleep         Complications:  Encounter Notable Events  Notable Event Outcome Phase Comment  Agitation requiring restraint or medication  Intraprocedure

## 2024-02-06 NOTE — Anesthesia Preprocedure Evaluation (Signed)
 Anesthesia Evaluation  Patient identified by MRN, date of birth, ID band Patient awake    Reviewed: Allergy & Precautions, NPO status , Patient's Chart, lab work & pertinent test results  History of Anesthesia Complications Negative for: history of anesthetic complications  Airway Mallampati: III  TM Distance: <3 FB Neck ROM: full    Dental  (+) Chipped   Pulmonary neg shortness of breath, former smoker   Pulmonary exam normal        Cardiovascular Exercise Tolerance: Good hypertension, (-) angina Normal cardiovascular exam     Neuro/Psych negative neurological ROS  negative psych ROS   GI/Hepatic negative GI ROS, Neg liver ROS,neg GERD  ,,  Endo/Other  negative endocrine ROS    Renal/GU negative Renal ROS  negative genitourinary   Musculoskeletal   Abdominal   Peds  Hematology negative hematology ROS (+)   Anesthesia Other Findings Past Medical History: No date: Arthritis No date: Elevated BP without diagnosis of hypertension No date: Hypertension No date: Kartagener syndrome No date: Multiple nasal polyps No date: Osteoarthritis of ankle No date: Osteoarthritis of both knees No date: Positive colorectal cancer screening using Cologuard test No date: Situs inversus  Past Surgical History: No date: SEPTOPLASTY No date: TONSILLECTOMY No date: tubes in ears; Bilateral  BMI    Body Mass Index: 30.60 kg/m      Reproductive/Obstetrics negative OB ROS                             Anesthesia Physical Anesthesia Plan  ASA: 2  Anesthesia Plan: General   Post-op Pain Management:    Induction: Intravenous  PONV Risk Score and Plan: Propofol infusion and TIVA  Airway Management Planned: Natural Airway and Nasal Cannula  Additional Equipment:   Intra-op Plan:   Post-operative Plan:   Informed Consent: I have reviewed the patients History and Physical, chart, labs and  discussed the procedure including the risks, benefits and alternatives for the proposed anesthesia with the patient or authorized representative who has indicated his/her understanding and acceptance.     Dental Advisory Given  Plan Discussed with: Anesthesiologist, CRNA and Surgeon  Anesthesia Plan Comments: (Patient consented for risks of anesthesia including but not limited to:  - adverse reactions to medications - risk of airway placement if required - damage to eyes, teeth, lips or other oral mucosa - nerve damage due to positioning  - sore throat or hoarseness - Damage to heart, brain, nerves, lungs, other parts of body or loss of life  Patient voiced understanding and assent.)       Anesthesia Quick Evaluation

## 2024-02-07 ENCOUNTER — Encounter: Payer: Self-pay | Admitting: Internal Medicine

## 2024-02-07 LAB — SURGICAL PATHOLOGY

## 2024-02-07 NOTE — Anesthesia Postprocedure Evaluation (Signed)
 Anesthesia Post Note  Patient: Nobuo Nunziata  Procedure(s) Performed: COLONOSCOPY POLYPECTOMY, INTESTINE  Patient location during evaluation: Endoscopy Anesthesia Type: General Level of consciousness: awake and alert Pain management: pain level controlled Vital Signs Assessment: post-procedure vital signs reviewed and stable Respiratory status: spontaneous breathing, nonlabored ventilation, respiratory function stable and patient connected to nasal cannula oxygen Cardiovascular status: blood pressure returned to baseline and stable Postop Assessment: no apparent nausea or vomiting Anesthetic complications: yes   Encounter Notable Events  Notable Event Outcome Phase Comment  Agitation requiring restraint or medication  Intraprocedure      Last Vitals:  Vitals:   02/06/24 1326 02/06/24 1332  BP:    Pulse: 79   Resp: 16 12  Temp:    SpO2:      Last Pain:  Vitals:   02/06/24 1342  TempSrc:   PainSc: 0-No pain                 Portia Brittle Zaheer Wageman
